# Patient Record
Sex: Male | Born: 1975 | Race: White | Hispanic: No | Marital: Single | Smoking: Former smoker
Health system: Southern US, Community
[De-identification: ages and names within clinical notes are randomized; demographics above are authoritative.]

## PROBLEM LIST (undated history)

## (undated) DIAGNOSIS — R7303 Prediabetes: Secondary | ICD-10-CM

## (undated) DIAGNOSIS — I10 Essential (primary) hypertension: Secondary | ICD-10-CM

## (undated) DIAGNOSIS — I509 Heart failure, unspecified: Secondary | ICD-10-CM

## (undated) DIAGNOSIS — Z6841 Body Mass Index (BMI) 40.0 and over, adult: Secondary | ICD-10-CM

## (undated) DIAGNOSIS — Z86711 Personal history of pulmonary embolism: Secondary | ICD-10-CM

## (undated) DIAGNOSIS — J189 Pneumonia, unspecified organism: Secondary | ICD-10-CM

## (undated) DIAGNOSIS — J45909 Unspecified asthma, uncomplicated: Secondary | ICD-10-CM

## (undated) DIAGNOSIS — Z973 Presence of spectacles and contact lenses: Secondary | ICD-10-CM

## (undated) DIAGNOSIS — G4730 Sleep apnea, unspecified: Secondary | ICD-10-CM

## (undated) DIAGNOSIS — M1990 Unspecified osteoarthritis, unspecified site: Secondary | ICD-10-CM

## (undated) DIAGNOSIS — S83206A Unspecified tear of unspecified meniscus, current injury, right knee, initial encounter: Secondary | ICD-10-CM

## (undated) HISTORY — PX: HERNIA REPAIR: SHX51

## (undated) HISTORY — PX: TONSILLECTOMY: SUR1361

## (undated) HISTORY — PX: EYE SURGERY: SHX253

---

## 2008-07-01 ENCOUNTER — Emergency Department (HOSPITAL_COMMUNITY): Admission: EM | Admit: 2008-07-01 | Discharge: 2008-07-01 | Payer: Self-pay | Admitting: Emergency Medicine

## 2010-01-01 IMAGING — CR DG CHEST 2V
2 series · 2 of 2 positions shown · non-contrast
Comparison: None

CLINICAL DATA: Shortness of breath and cough, right chest pain

CHEST - 2 VIEW

[w chest pa]
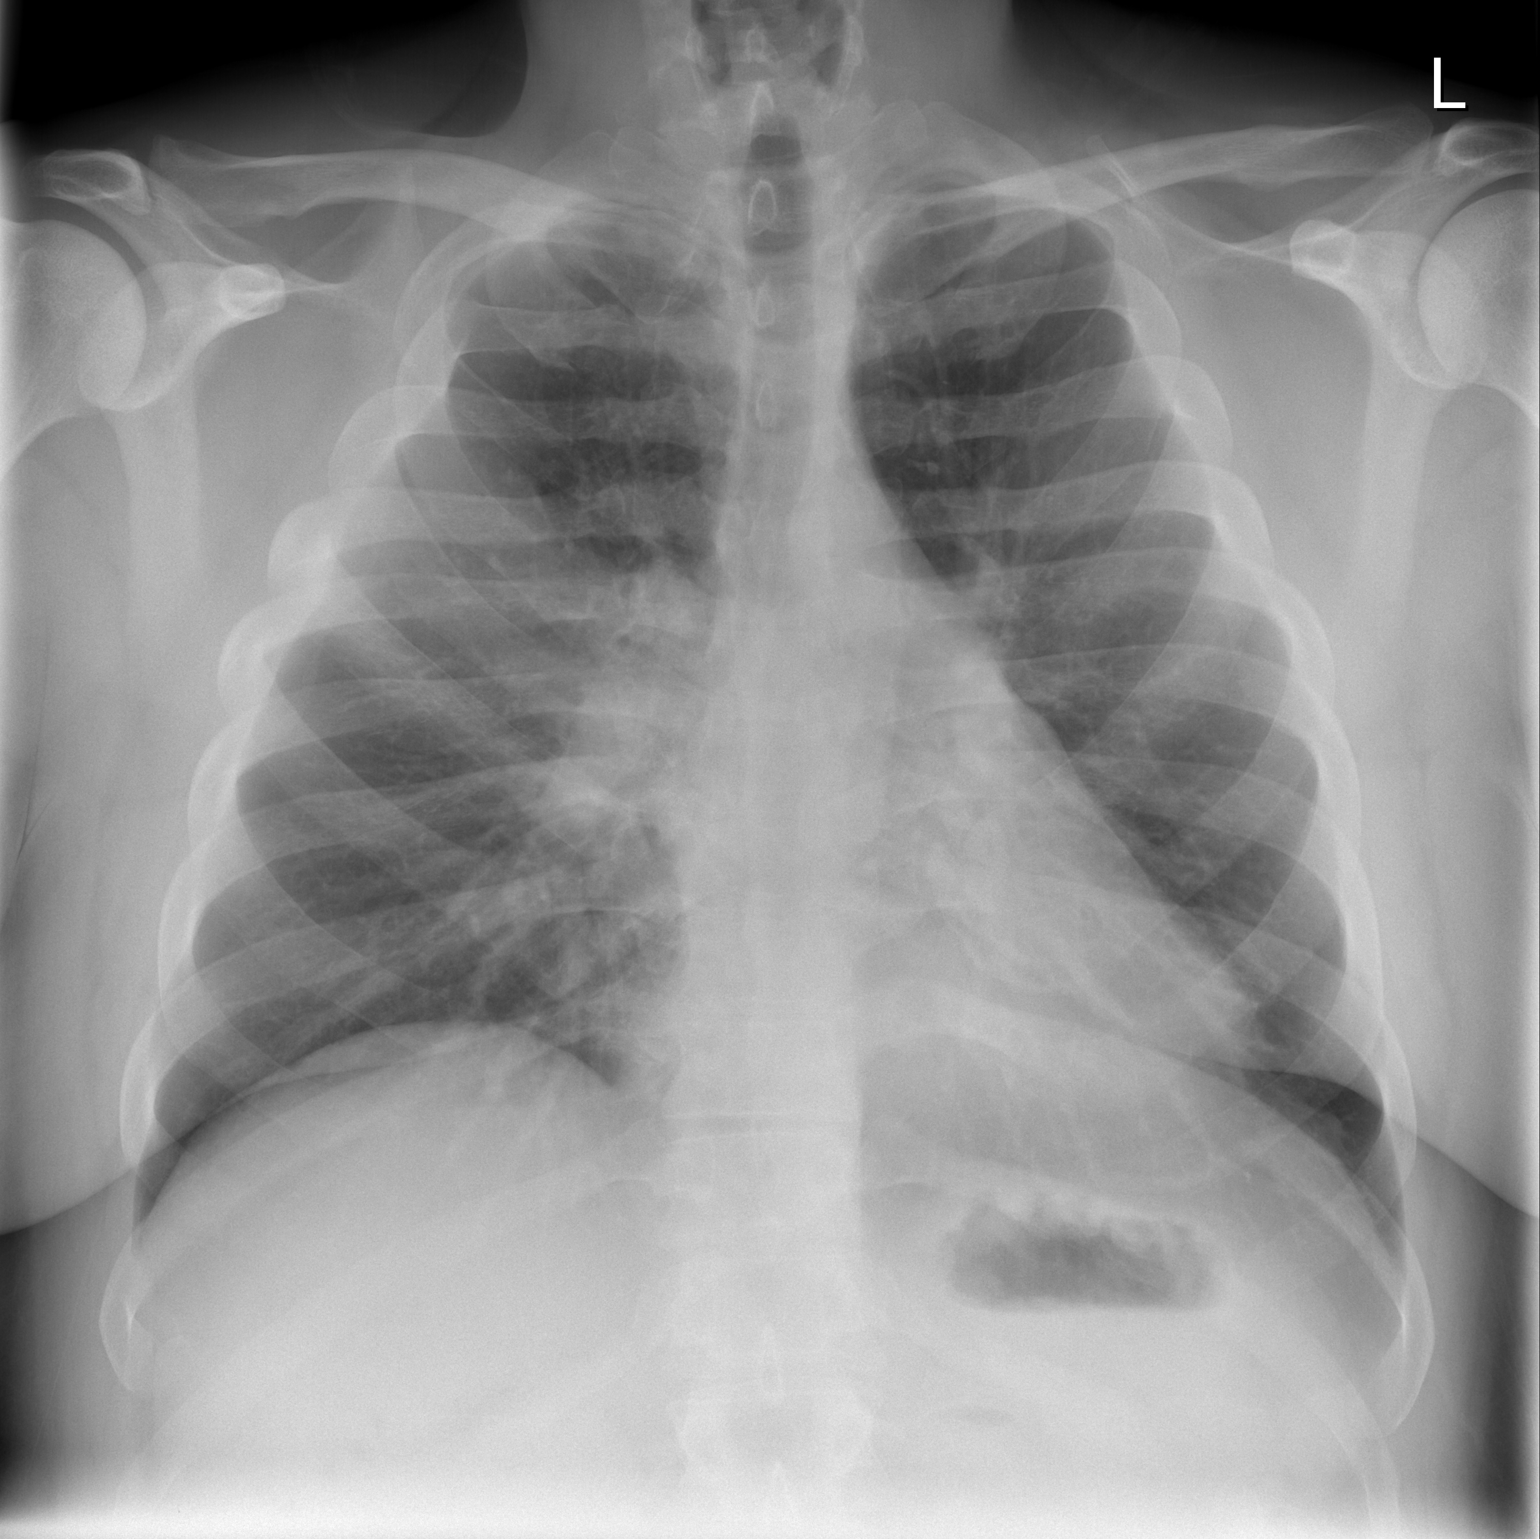

[w chest lat]
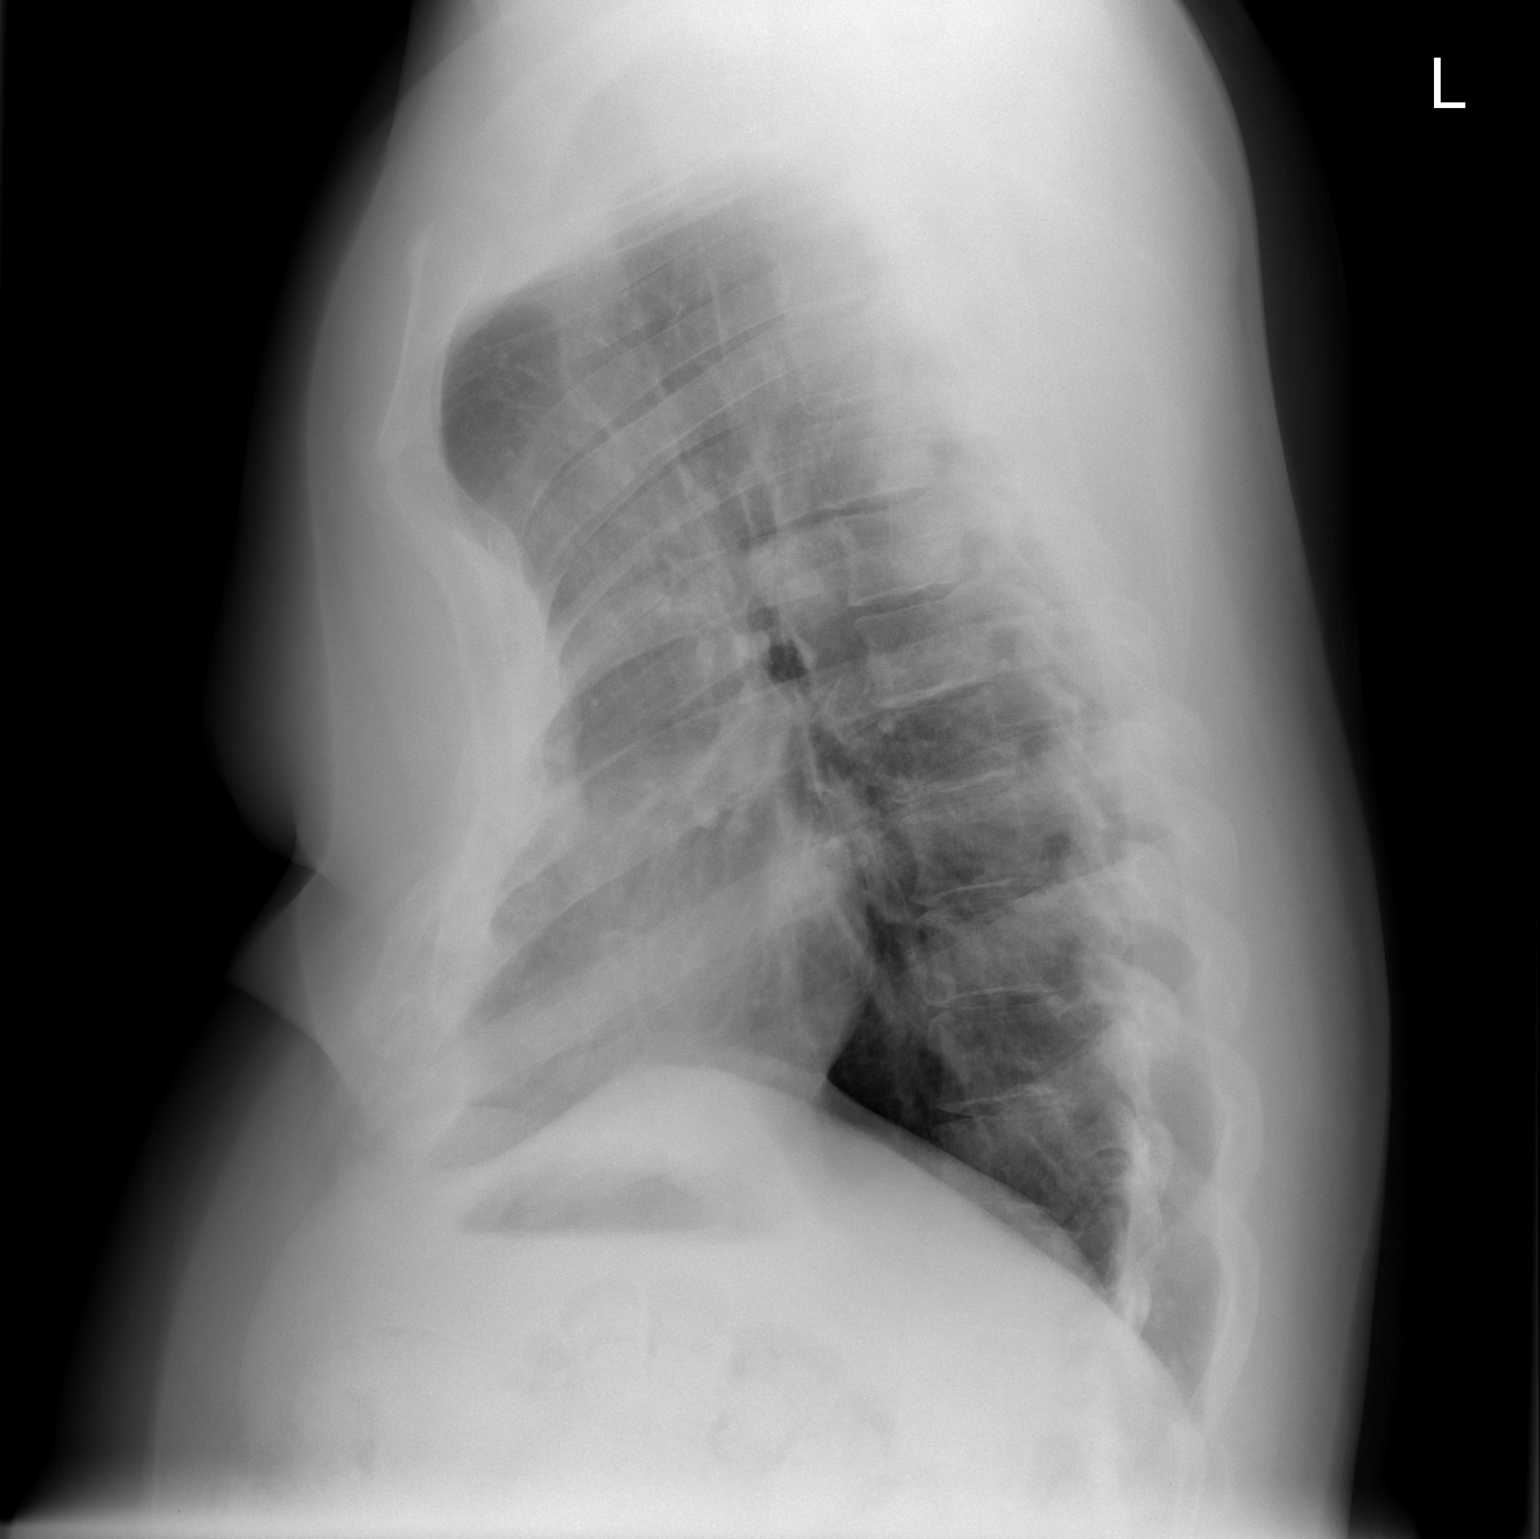

[2 of 2 positions shown; findings below may reference images not displayed]

FINDINGS: There is a pectus variant deformity of the thoracic cage.
There is mild central peribronchial thickening.  No confluent
airspace infiltrate or overt edema.  No effusion.  Heart size
normal.
IMPRESSION: Mild central peribronchial thickening suggesting bronchitis,
asthma, or viral syndrome.

## 2010-08-08 LAB — CBC
HCT: 45.2 % (ref 39.0–52.0)
Hemoglobin: 14.8 g/dL (ref 13.0–17.0)
MCHC: 32.8 g/dL (ref 30.0–36.0)
MCV: 88.2 fL (ref 78.0–100.0)
RBC: 5.13 MIL/uL (ref 4.22–5.81)
RDW: 13.8 % (ref 11.5–15.5)

## 2010-08-08 LAB — POCT CARDIAC MARKERS
CKMB, poc: 2.6 ng/mL (ref 1.0–8.0)
Myoglobin, poc: 67.4 ng/mL (ref 12–200)
Myoglobin, poc: 70.6 ng/mL (ref 12–200)
Troponin i, poc: <0.05 ng/mL (ref 0.00–0.09)

## 2010-08-08 LAB — DIFFERENTIAL
Basophils Relative: 0 % (ref 0–1)
Eosinophils Relative: 4 % (ref 0–5)
Monocytes Absolute: 0.6 10*3/uL (ref 0.1–1.0)
Monocytes Relative: 7 % (ref 3–12)
Neutro Abs: 6 10*3/uL (ref 1.7–7.7)

## 2010-08-08 LAB — BASIC METABOLIC PANEL
CO2: 26 mEq/L (ref 19–32)
Calcium: 8.8 mg/dL (ref 8.4–10.5)
GFR calc Af Amer: >60 mL/min (ref >60)
GFR calc non Af Amer: >60 mL/min (ref >60)
Potassium: 3.9 mEq/L (ref 3.5–5.1)
Sodium: 137 mEq/L (ref 135–145)

## 2013-03-07 ENCOUNTER — Emergency Department (HOSPITAL_COMMUNITY)
Admission: EM | Admit: 2013-03-07 | Discharge: 2013-03-07 | Disposition: A | Discharge status: Home / Self Care | Payer: Self-pay | Attending: Emergency Medicine | Admitting: Emergency Medicine

## 2013-03-07 ENCOUNTER — Encounter (HOSPITAL_COMMUNITY): Payer: Self-pay | Admitting: Emergency Medicine

## 2013-03-07 DIAGNOSIS — F17200 Nicotine dependence, unspecified, uncomplicated: Secondary | ICD-10-CM | POA: Insufficient documentation

## 2013-03-07 DIAGNOSIS — Y92009 Unspecified place in unspecified non-institutional (private) residence as the place of occurrence of the external cause: Secondary | ICD-10-CM | POA: Insufficient documentation

## 2013-03-07 DIAGNOSIS — Y9389 Activity, other specified: Secondary | ICD-10-CM | POA: Insufficient documentation

## 2013-03-07 DIAGNOSIS — T401X1A Poisoning by heroin, accidental (unintentional), initial encounter: Secondary | ICD-10-CM | POA: Insufficient documentation

## 2013-03-07 DIAGNOSIS — T401X4A Poisoning by heroin, undetermined, initial encounter: Secondary | ICD-10-CM | POA: Insufficient documentation

## 2013-03-07 DIAGNOSIS — I498 Other specified cardiac arrhythmias: Secondary | ICD-10-CM | POA: Insufficient documentation

## 2013-03-07 LAB — COMPREHENSIVE METABOLIC PANEL
AST: 47 U/L — ABNORMAL HIGH (ref 0–37)
Albumin: 3.5 g/dL (ref 3.5–5.2)
Calcium: 9 mg/dL (ref 8.4–10.5)
Creatinine, Ser: 0.92 mg/dL (ref 0.50–1.35)
Total Protein: 8.7 g/dL — ABNORMAL HIGH (ref 6.0–8.3)

## 2013-03-07 LAB — CBC
MCH: 29.9 pg (ref 26.0–34.0)
MCV: 92.3 fL (ref 78.0–100.0)
Platelets: 235 10*3/uL (ref 150–400)
RDW: 14.3 % (ref 11.5–15.5)

## 2013-03-07 LAB — ACETAMINOPHEN LEVEL: Acetaminophen (Tylenol), Serum: <15 ug/mL (ref 10–30)

## 2013-03-07 LAB — SALICYLATE LEVEL: Salicylate Lvl: <2 mg/dL — ABNORMAL LOW (ref 2.8–20.0)

## 2013-03-07 MED ORDER — SODIUM CHLORIDE 0.9 % IV BOLUS (SEPSIS)
1000.0000 mL | Freq: Once | INTRAVENOUS | Status: AC
Start: 2013-03-07 — End: 2013-03-07
  Administered 2013-03-07: 1000 mL via INTRAVENOUS

## 2013-03-07 MED ORDER — ONDANSETRON HCL 4 MG/2ML IJ SOLN
4.0000 mg | Freq: Once | INTRAMUSCULAR | Status: AC
  Administered 2013-03-07: 4 mg via INTRAVENOUS
  Filled 2013-03-07: qty 2

## 2013-03-07 NOTE — ED Notes (Signed)
Pt arrived via EMS with a complaint of a heroin overdose.  Pt states he used a new supplier and shot up in front of friends and immediately passed out.  Friends called EMS.  Upon arrival EMS assessed that the patient was not breathing and a pulse of 50, with a palpated BP of 100.  EMS administered 2 Narcan and patient responded and regained consciousness.

## 2013-03-07 NOTE — ED Provider Notes (Signed)
CSN: [id number]     Arrival date & time 03/07/13  0223 History   First MD Initiated Contact with Patient 03/07/13 0241     Chief Complaint  Patient presents with  . Medical Clearance  . Drug Overdose   (Consider location/radiation/quality/duration/timing/severity/associated sxs/prior Treatment) HPI History provided by patient and EMS.  Was at a friend's house tonight, injected heroin and became unresponsive. EMS was called, reportedly bradycardic without respirations and patient was given Narcan. He regained consciousness and was brought to the emergency department. 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 He states that he has been hanging out with the "wrong crowd".  He has a primary care physician in [place] and is prescribed medications for blood pressure and diabetes, states he is not taking his medications in some time. He declines any information on drug rehabilitation or desire for detox.  At time of my evaluation, he would like to be discharged from the emergency department, but is agreeable to period of observation.   History reviewed. No pertinent past medical history. History reviewed. No pertinent past surgical history. History reviewed. No pertinent family history. History  Substance Use Topics  . Smoking status: Current Some Day Smoker  . Smokeless tobacco: Not on file  . Alcohol Use: Yes    Review of Systems  Constitutional: Negative for fever and chills.  Eyes: Negative for pain.  Respiratory: Negative for shortness of breath.   Cardiovascular: Negative for chest pain.  Gastrointestinal: Negative for vomiting and abdominal pain.  Genitourinary: Negative for dysuria.  Musculoskeletal: Negative for back pain, neck pain and neck stiffness.  Skin: Negative for rash.  Neurological: Negative for seizures and headaches.  All other systems reviewed and are negative.    Allergies  Review of patient's allergies indicates no known allergies.  Home  Medications   Current Outpatient Rx  Name  Route  Sig  Dispense  Refill  . aspirin EC 325 MG tablet   Oral   Take 325 mg by mouth every 6 (six) hours as needed.         Marland Kitchen guaiFENesin (MUCINEX) 600 MG 12 hr tablet   Oral   Take 600 mg by mouth 2 (two) times daily as needed for cough.         . Homeopathic Products (ZICAM COLD REMEDY NA)   Nasal   Place 1 spray into the nose daily as needed (cold symptoms).          BP 123/80  Pulse 84  Temp(Src) 98.2 F (36.8 C) (Oral)  Resp 16  Ht 5\' 11"  (1.803 m)  Wt 355 lb (161.027 kg)  BMI 49.53 kg/m2  SpO2 92% Physical Exam  Constitutional: He is oriented to person, place, and time. He appears well-developed and well-nourished.  HENT:  Head: Normocephalic and atraumatic.  Mouth/Throat: Oropharynx is clear and moist.  Eyes: EOM are normal. Pupils are equal, round, and reactive to light. No scleral icterus.  Neck: Neck supple.  Cardiovascular: Normal rate, regular rhythm and intact distal pulses.   Pulmonary/Chest: Effort normal and breath sounds normal. No respiratory distress. He exhibits no tenderness.  Abdominal: Soft. Bowel sounds are normal. He exhibits no distension. There is no tenderness.  Musculoskeletal: Normal range of motion. He exhibits no edema.  Neurological: He is alert and oriented to person, place, and time. He displays normal reflexes. No cranial nerve deficit. He exhibits normal muscle tone. Coordination normal.  Skin: Skin is warm and dry.    ED Course  Procedures (including critical care time) Labs Review Labs Reviewed  GLUCOSE, CAPILLARY - Abnormal; Notable for the following:    Glucose-Capillary 189 (*)    All other components within normal limits  COMPREHENSIVE METABOLIC PANEL - Abnormal; Notable for the following:    Glucose, Bld 190 (*)    Total Protein 8.7 (*)    AST 47 (*)    All other components within normal limits  SALICYLATE LEVEL - Abnormal; Notable for the following:    Salicylate Lvl  <2.0 (*)    All other components within normal limits  CBC  ETHANOL  ACETAMINOPHEN LEVEL  URINE RAPID DRUG SCREEN (HOSP PERFORMED)     Date: 03/07/2013  Rate: 84  Rhythm: normal sinus rhythm  QRS Axis: normal  Intervals: normal  ST/T Wave abnormalities: nonspecific ST/T changes  Conduction Disutrbances:none  Narrative Interpretation:   Old EKG Reviewed: none available  IV fluids. Cardiac monitoring. Serial evaluations without significant change in condition  6:44 AM patient awake and ambulating, drinking water and requesting to be discharged home. Patient calling for a ride. Outpatient resources provided. Strict return precautions verbalized is understood.  MDM  Heroin overdose - reversed with Narcan prior to arrival  Observed in the emergency department without complications Labs obtained and reviewed as above. vital signs and nursing notes reviewed and considered    Sunnie Nielsen, MD 03/07/13 [phone]

## 2013-03-07 NOTE — ED Notes (Signed)
Bed: RESB Expected date: 03/07/13 Expected time: 2:19 AM Means of arrival: Ambulance Comments: Res B, EMS, 69 M, Heroin OD

## 2016-07-14 ENCOUNTER — Ambulatory Visit (INDEPENDENT_AMBULATORY_CARE_PROVIDER_SITE_OTHER): Payer: Worker's Compensation

## 2016-07-14 ENCOUNTER — Ambulatory Visit (INDEPENDENT_AMBULATORY_CARE_PROVIDER_SITE_OTHER): Payer: Worker's Compensation | Admitting: Orthopaedic Surgery

## 2016-07-14 DIAGNOSIS — M25561 Pain in right knee: Secondary | ICD-10-CM | POA: Diagnosis not present

## 2016-07-14 MED ORDER — METHYLPREDNISOLONE ACETATE 40 MG/ML IJ SUSP
40.0000 mg | INTRAMUSCULAR | Status: AC | PRN
Start: 2016-07-14 — End: 2016-07-14
  Administered 2016-07-14: 40 mg via INTRA_ARTICULAR

## 2016-07-14 NOTE — Progress Notes (Deleted)
   Office Visit Note   Patient: Kenneth Frazier           Date of Birth: 1975/12/26           MRN: [id number] Visit Date: 07/14/2016              Requested by: No referring provider defined for this encounter. PCP: No primary care provider on file.   Assessment & Plan: Visit Diagnoses:  1. Acute pain of right knee     Plan: ***  Follow-Up Instructions: Return in about 3 weeks (around 08/04/2016).   Orders:  Orders Placed This Encounter  Procedures  . Large Joint Injection/Arthrocentesis  . XR Knee 1-2 Views Right   No orders of the defined types were placed in this encounter.     Procedures: No procedures performed   Clinical Data: No additional findings.   Subjective: No chief complaint on file.   HPI  Review of Systems   Objective: Vital Signs: There were no vitals taken for this visit.  Physical Exam  Ortho Exam  Specialty Comments:  No specialty comments available.  Imaging: Xr Knee 1-2 Views Right  Result Date: 07/14/2016 An AP and lateral of his right knee show good alignment overall. There is some slight medial joint space narrowing which is more age-related. There is no evidence of fracture. Is no effusion.    PMFS History: There are no active problems to display for this patient.  No past medical history on file.  No family history on file.  No past surgical history on file. Social History   Occupational History  . Not on file.   Social History Main Topics  . Smoking status: Current Some Day Smoker  . Smokeless tobacco: Not on file  . Alcohol use Yes  . Drug use: Yes  . Sexual activity: Yes                                                                                                               ------

## 2016-07-14 NOTE — Progress Notes (Signed)
   Office Visit Note   Patient: Kenneth Frazier           Date of Birth: [birth year]           MRN: [id number] Visit Date: 07/14/2016              Requested by: No referring provider defined for this encounter. PCP: No primary care provider on file.   Assessment & Plan: Visit Diagnoses:  1. Acute pain of right knee     Plan: He tolerated the steroid injection well and his right knee. I will have him discontinue his knee brace and work on quad training exercises. Again is really pain-free stitches knee medications but worried about instability symptoms of these having. If he still having the symptoms on his next visit this would definitely warranted MRI. This would be to assess the meniscus of his knee. He'll continue his knee brace and quad strengthening exercises and physical therapy in the interim.  Follow-Up Instructions: Return in about 3 weeks (around 08/04/2016).   Orders:  Orders Placed This Encounter  Procedures  . Large Joint Injection/Arthrocentesis  . XR Knee 1-2 Views Right   No orders of the defined types were placed in this encounter.     Procedures: Large Joint Inj Date/Time: 07/14/2016 3:43 PM Performed by: Kathryne HitchBLACKMAN, Jastin Fore Y Authorized by: Kathryne HitchBLACKMAN, Thedore Pickel Y   Location:  Knee Site:  R knee Ultrasound Guidance: No   Fluoroscopic Guidance: No   Arthrogram: No   Medications:  40 mg methylPREDNISolone acetate 40 MG/ML     Clinical Data: No additional findings.   Subjective: No chief complaint on file. Patient is a very pleasant 41 year old gentleman with no previous right knee problems who injured his right knee when he actually slipped on wet floor in the bathroom at his job. His knee hyperextended and torqued a little bit. He said posterior pain since then. He is tried rest, ice, heat, anti-inflammatories and physical therapy. He says the knee really doesn't hurt much and he hasn't missed any work it just feels unstable to him. He is wearing a  knee brace. He is someone who does weigh about 400 pounds but again these had no previous knee problems before this.  HPI  Review of Systems Currently denies any headache, shortness of breath, fever, chills, nausea, vomiting.  Objective: Vital Signs: There were no vitals taken for this visit.  Physical Exam He is a very pleasant individual who is alert and oriented 3 in no acute distress Ortho Exam Examination of his right knee shows no effusion with good range of motion. He does have posterior lateral and posterior medial tenderness on exam. His Lachman's is negative but is really hard to examine based on his size is getting good range of motion the knee. His extensor mechanism is intact. Specialty Comments:  No specialty comments available.  Imaging: No results found.   PMFS History: There are no active problems to display for this patient.  No past medical history on file.  No family history on file.  No past surgical history on file. Social History   Occupational History  . Not on file.   Social History Main Topics  . Smoking status: Current Some Day Smoker  . Smokeless tobacco: Not on file  . Alcohol use Yes  . Drug use: Yes  . Sexual activity: Yes

## 2016-07-16 ENCOUNTER — Telehealth (INDEPENDENT_AMBULATORY_CARE_PROVIDER_SITE_OTHER): Payer: Self-pay | Admitting: Orthopaedic Surgery

## 2016-07-16 NOTE — Telephone Encounter (Signed)
[facility] WANTS TO KNOW IF PT NEEDS TO CONTINUE PT, IF SO IF WE CAN SEND THEM AN ORDER AS WELL AS A OFFICE NOTE.  [phone] FAX  [phone] CALL

## 2016-07-16 NOTE — Telephone Encounter (Signed)
He does not need to continue PT.

## 2016-07-16 NOTE — Telephone Encounter (Signed)
LMOM of the below message  

## 2016-07-16 NOTE — Telephone Encounter (Signed)
Please advise 

## 2016-07-17 ENCOUNTER — Telehealth (INDEPENDENT_AMBULATORY_CARE_PROVIDER_SITE_OTHER): Payer: Self-pay | Admitting: Orthopaedic Surgery

## 2016-07-17 NOTE — Telephone Encounter (Signed)
[facility] states that the patient called them stating that Dr. Magnus IvanBlackman told him he was to continue with his physcial therapy.  Thank you.

## 2016-07-17 NOTE — Telephone Encounter (Signed)
I just wanted therapy to work on his mobility and gait training only with no strengthening of his right hip.

## 2016-07-17 NOTE — Telephone Encounter (Signed)
Called Paula back and no answer LMOM with all details.

## 2016-07-17 NOTE — Telephone Encounter (Signed)
Gunnar FusiPaula from [facility] was returning you phone call. CB # [phone]

## 2016-07-17 NOTE — Telephone Encounter (Signed)
Paula from [facility] was returning you phone call. CB # [phone] 

## 2016-07-17 NOTE — Telephone Encounter (Signed)
They need clarification need CB.

## 2016-07-17 NOTE — Telephone Encounter (Signed)
Pt called back about this stating they do not have orders

## 2016-07-18 ENCOUNTER — Telehealth (INDEPENDENT_AMBULATORY_CARE_PROVIDER_SITE_OTHER): Payer: Self-pay

## 2016-07-18 NOTE — Telephone Encounter (Signed)
[place] [facility] [facility] rx for this patient. Wrote a rx based off of Dr. Vevelyn RoyalsBlackmans response in a previous message and faxed to her @ [phone].

## 2016-08-06 ENCOUNTER — Ambulatory Visit (INDEPENDENT_AMBULATORY_CARE_PROVIDER_SITE_OTHER): Payer: Worker's Compensation | Admitting: Orthopaedic Surgery

## 2016-08-06 DIAGNOSIS — M25561 Pain in right knee: Secondary | ICD-10-CM | POA: Diagnosis not present

## 2016-08-06 NOTE — Progress Notes (Signed)
The patient is continue to follow-up for his right knee. His knee was injured on-the-job in February. Prior to that he had never had any problems with his knee. He still been working on physical therapy and that is helped some but he still having some mechanical symptoms of the right knee on the medial joint line. He gets some locking catching as well. He has tried anti-inflammatories, rest, ice, heat and most recently a intra-articular steroid injection. He said the steroid injection helped great for about 2 weeks and now is back to have mechanical symptoms again in his knee.  On examination he definitely has a positive Murray sign to the medial compartment of his right knee. He has pain over the medial collateral ligament but is ligamentous is stable. His pain over the medial joint line and the meniscus itself.  At this point given the failure of all forms conservative treatment is continued pain and mechanical symptoms and MRI is warranted to rule out a meniscal tear. He'll continue physical therapy on his knee for to strengthen the knee and working on balance and proprioception but definitely an MRI is medically necessary. He'll continue his current levels of work duties.

## 2016-08-07 ENCOUNTER — Other Ambulatory Visit (INDEPENDENT_AMBULATORY_CARE_PROVIDER_SITE_OTHER): Payer: Self-pay

## 2016-08-07 DIAGNOSIS — G8929 Other chronic pain: Secondary | ICD-10-CM

## 2016-08-07 DIAGNOSIS — M25561 Pain in right knee: Principal | ICD-10-CM

## 2016-09-03 ENCOUNTER — Encounter (INDEPENDENT_AMBULATORY_CARE_PROVIDER_SITE_OTHER): Payer: Self-pay | Admitting: Orthopaedic Surgery

## 2016-09-03 ENCOUNTER — Ambulatory Visit (INDEPENDENT_AMBULATORY_CARE_PROVIDER_SITE_OTHER): Payer: Worker's Compensation | Admitting: Orthopaedic Surgery

## 2016-09-03 VITALS — Ht 71.0 in | Wt 355.0 lb

## 2016-09-03 DIAGNOSIS — S83281A Other tear of lateral meniscus, current injury, right knee, initial encounter: Secondary | ICD-10-CM | POA: Insufficient documentation

## 2016-09-03 DIAGNOSIS — S83281D Other tear of lateral meniscus, current injury, right knee, subsequent encounter: Secondary | ICD-10-CM

## 2016-09-03 NOTE — Progress Notes (Signed)
The patient is well-known to me. He is following up after MRI of his right knee. He injured this knee accident. He is tried and failed all forms conservative treatment including rest, ice, heat, anti-inflammatories, activity modification, physical therapy, and steroid injections. He still is been work for all this causes work is low demand. With continued pain and problems with pivoting activities we sent him for an MRI to evaluate the cartilage and meniscus of his knee. He still having his same symptoms.  On exam he has medial and lateral joint line tenderness and he does have a positive McMurray sign to the medial and lateral side. Most his pain is medial. There is no effusion. His range of motion is full. His Lachman's exam is negative.  The MRI is reviewed with him and it does show a meniscal root tear the posterior horn of medial meniscus. There appears to be though a mid body tear with a flap component of the lateral meniscus. That one appears more acute. He has minimal cartilage thinning in the knee.  Given his failure conservative treatment and continued mechanical symptoms I'm recommending an arthroscopic intervention for his right knee. I showed him a knee model and explained in detail what surgery would involve some this is something is definitely interested in given his continued mechanical symptoms. We talked about the risk and benefits of surgery and what is intraoperative and postoperative course would be. I would let him get back to work within a few days after surgery as well since he has low demand work and is motivated to do so. Since this is [employer], we will need to get approval for this surgery and he understands this as well. All questions were encouraged and answered. If surgery is approved we will then see him back at one week postoperative for suture removal.

## 2016-09-08 ENCOUNTER — Telehealth (INDEPENDENT_AMBULATORY_CARE_PROVIDER_SITE_OTHER): Payer: Self-pay

## 2016-09-08 NOTE — Telephone Encounter (Signed)
Faxed the 09/03/16 office note to adj per her request 

## 2016-09-12 ENCOUNTER — Telehealth (INDEPENDENT_AMBULATORY_CARE_PROVIDER_SITE_OTHER): Payer: Self-pay | Admitting: Orthopaedic Surgery

## 2016-09-12 NOTE — Telephone Encounter (Signed)
[facility] CALLED, LEFT VM AND REQUESTED PT MRI RESULTS AND LAST OFFICE NOTE FAXED TO [phone] PLEASE.

## 2016-09-15 NOTE — Telephone Encounter (Signed)
MRI results and Last ov notes sent to [facility] as requested

## 2016-09-24 ENCOUNTER — Other Ambulatory Visit (INDEPENDENT_AMBULATORY_CARE_PROVIDER_SITE_OTHER): Payer: Self-pay | Admitting: Physician Assistant

## 2016-09-24 ENCOUNTER — Encounter (HOSPITAL_COMMUNITY): Payer: Self-pay | Admitting: *Deleted

## 2016-09-24 NOTE — Progress Notes (Signed)
Anesthesia Chart Review:  Pt is a same day work up  Pt is a 41 year old male scheduled for R knee arthroscopy with partial meniscectomy on 09/25/2016 with Doneen Poissonhristopher Blackman, M.D.  [facility]: CHF, HTN, borderline diabetes, asthma, PE, OSA. Former smoker. BMI 49.5.  - [facility] with PE. Was on coumadin through early 2017 when he stopped going to anti-coag visits and was [facility].   - [facility] for acute on chronic COPD, hypoxic/mixed respiratory failure, OSA (started on bipap)  Medications include: ASA 325 mg.  Labs will be obtained DOS  EKG will be obtained DOS  Echo 12/13/14 (care everywhere): 1. Very poor image quality. 2. LV size is normal. Systolic function is normal. EF 55-60%. Unable to fully assess LV regional wall motion. Line 2. RV normal in size and function. 3. LA, RA, cardiac valves not well visualized. 4. No pericardial effusion 5. Not suitable for comparison with any study.  Right heart cath 11/07/14 (care everywhere): - Pulmonary Hypertension: Yes - Elevated RA pressure - Elevated wedge pressure - No oxygen step up between SVC and PA  - Mild pulmonary hypertension - Low cardiac output  Pt will need further assessment by assigned anesthesiologist DOS.   Rica Mastngela Jeralynn Vaquera, FNP-BC [facility]Anesthesiology Phone: [phone] 09/24/2016 4:00 PM

## 2016-09-24 NOTE — Progress Notes (Signed)
Pt denies any acute cardiopulmonary issues. Pt denies being under the care of a cardiologist. Pt denies having a stress test. Pt denies having an EKG and chest x ray within the last year. Pt made aware to stop taking  Aspirin, vitamins, fish oil and herbal medications. Do not take any NSAIDs ie: Ibuprofen, Advil, Naproxen, BC and Goody Powder or any medication containing Aspirin. Pt verbalized understanding of all pre-op instructions. Anesthesia asked to review pt history.

## 2016-09-25 ENCOUNTER — Ambulatory Visit (HOSPITAL_COMMUNITY)
Admission: RE | Admit: 2016-09-25 | Discharge: 2016-09-25 | Disposition: A | Discharge status: Home / Self Care | Payer: Worker's Compensation | Source: Ambulatory Visit | Attending: Orthopaedic Surgery | Admitting: Orthopaedic Surgery

## 2016-09-25 ENCOUNTER — Encounter (HOSPITAL_COMMUNITY): Payer: Self-pay | Admitting: Certified Registered Nurse Anesthetist

## 2016-09-25 ENCOUNTER — Encounter (HOSPITAL_COMMUNITY)
Admission: RE | Disposition: A | Discharge status: Home / Self Care | Payer: Self-pay | Source: Ambulatory Visit | Attending: Orthopaedic Surgery

## 2016-09-25 ENCOUNTER — Ambulatory Visit (HOSPITAL_COMMUNITY): Payer: Worker's Compensation | Admitting: Emergency Medicine

## 2016-09-25 DIAGNOSIS — G4730 Sleep apnea, unspecified: Secondary | ICD-10-CM | POA: Insufficient documentation

## 2016-09-25 DIAGNOSIS — J45909 Unspecified asthma, uncomplicated: Secondary | ICD-10-CM | POA: Diagnosis not present

## 2016-09-25 DIAGNOSIS — Z79899 Other long term (current) drug therapy: Secondary | ICD-10-CM | POA: Insufficient documentation

## 2016-09-25 DIAGNOSIS — X58XXXA Exposure to other specified factors, initial encounter: Secondary | ICD-10-CM | POA: Diagnosis not present

## 2016-09-25 DIAGNOSIS — Y990 Civilian activity done for income or pay: Secondary | ICD-10-CM | POA: Diagnosis not present

## 2016-09-25 DIAGNOSIS — S83241A Other tear of medial meniscus, current injury, right knee, initial encounter: Secondary | ICD-10-CM | POA: Insufficient documentation

## 2016-09-25 DIAGNOSIS — Z86711 Personal history of pulmonary embolism: Secondary | ICD-10-CM | POA: Insufficient documentation

## 2016-09-25 DIAGNOSIS — Z6841 Body Mass Index (BMI) 40.0 and over, adult: Secondary | ICD-10-CM | POA: Diagnosis not present

## 2016-09-25 DIAGNOSIS — I110 Hypertensive heart disease with heart failure: Secondary | ICD-10-CM | POA: Diagnosis not present

## 2016-09-25 DIAGNOSIS — S83281D Other tear of lateral meniscus, current injury, right knee, subsequent encounter: Secondary | ICD-10-CM | POA: Diagnosis not present

## 2016-09-25 DIAGNOSIS — Z87891 Personal history of nicotine dependence: Secondary | ICD-10-CM | POA: Diagnosis not present

## 2016-09-25 DIAGNOSIS — R7303 Prediabetes: Secondary | ICD-10-CM | POA: Diagnosis not present

## 2016-09-25 DIAGNOSIS — I509 Heart failure, unspecified: Secondary | ICD-10-CM | POA: Insufficient documentation

## 2016-09-25 DIAGNOSIS — S83281A Other tear of lateral meniscus, current injury, right knee, initial encounter: Secondary | ICD-10-CM | POA: Diagnosis present

## 2016-09-25 DIAGNOSIS — Y9289 Other specified places as the place of occurrence of the external cause: Secondary | ICD-10-CM | POA: Diagnosis not present

## 2016-09-25 HISTORY — DX: Unspecified tear of unspecified meniscus, current injury, right knee, initial encounter: S83.206A

## 2016-09-25 HISTORY — DX: Unspecified osteoarthritis, unspecified site: M19.90

## 2016-09-25 HISTORY — DX: Morbid (severe) obesity due to excess calories: E66.01

## 2016-09-25 HISTORY — PX: KNEE ARTHROSCOPY: SHX127

## 2016-09-25 HISTORY — DX: Essential (primary) hypertension: I10

## 2016-09-25 HISTORY — DX: Heart failure, unspecified: I50.9

## 2016-09-25 HISTORY — DX: Personal history of pulmonary embolism: Z86.711

## 2016-09-25 HISTORY — DX: Body Mass Index (BMI) 40.0 and over, adult: Z684

## 2016-09-25 HISTORY — DX: Pneumonia, unspecified organism: J18.9

## 2016-09-25 HISTORY — DX: Presence of spectacles and contact lenses: Z97.3

## 2016-09-25 HISTORY — DX: Unspecified asthma, uncomplicated: J45.909

## 2016-09-25 HISTORY — DX: Prediabetes: R73.03

## 2016-09-25 HISTORY — DX: Sleep apnea, unspecified: G47.30

## 2016-09-25 LAB — CBC
HCT: 44.9 % (ref 39.0–52.0)
HEMOGLOBIN: 14.8 g/dL (ref 13.0–17.0)
MCH: 30.1 pg (ref 26.0–34.0)
MCHC: 33 g/dL (ref 30.0–36.0)
MCV: 91.3 fL (ref 78.0–100.0)
PLATELETS: 241 10*3/uL (ref 150–400)
RBC: 4.92 MIL/uL (ref 4.22–5.81)
RDW: 14 % (ref 11.5–15.5)
WBC: 7.7 10*3/uL (ref 4.0–10.5)

## 2016-09-25 LAB — BASIC METABOLIC PANEL
ANION GAP: 10 (ref 5–15)
BUN: 14 mg/dL (ref 6–20)
CHLORIDE: 102 mmol/L (ref 101–111)
CO2: 23 mmol/L (ref 22–32)
Calcium: 8.5 mg/dL — ABNORMAL LOW (ref 8.9–10.3)
Creatinine, Ser: 0.92 mg/dL (ref 0.61–1.24)
Glucose, Bld: 168 mg/dL — ABNORMAL HIGH (ref 65–99)
POTASSIUM: 5.3 mmol/L — AB (ref 3.5–5.1)
Sodium: 135 mmol/L (ref 135–145)

## 2016-09-25 SURGERY — ARTHROSCOPY, KNEE
Anesthesia: General | Site: Knee | Laterality: Right

## 2016-09-25 MED ORDER — FENTANYL CITRATE (PF) 100 MCG/2ML IJ SOLN
INTRAMUSCULAR | Status: DC | PRN
  Administered 2016-09-25: 50 ug via INTRAVENOUS
  Administered 2016-09-25: 150 ug via INTRAVENOUS

## 2016-09-25 MED ORDER — PROMETHAZINE HCL 25 MG/ML IJ SOLN: 6.2500 mg | INTRAMUSCULAR | Status: DC | PRN

## 2016-09-25 MED ORDER — BUPIVACAINE HCL (PF) 0.25 % IJ SOLN
INTRAMUSCULAR | Status: DC | PRN
  Administered 2016-09-25: 30 mL

## 2016-09-25 MED ORDER — HYDROCODONE-ACETAMINOPHEN 5-325 MG PO TABS
ORAL_TABLET | ORAL | Status: AC
  Filled 2016-09-25: qty 1

## 2016-09-25 MED ORDER — FENTANYL CITRATE (PF) 250 MCG/5ML IJ SOLN
INTRAMUSCULAR | Status: AC
  Filled 2016-09-25: qty 5

## 2016-09-25 MED ORDER — MIDAZOLAM HCL 2 MG/2ML IJ SOLN
2.0000 mg | Freq: Once | INTRAMUSCULAR | Status: DC
  Filled 2016-09-25: qty 2

## 2016-09-25 MED ORDER — PROPOFOL 10 MG/ML IV BOLUS
INTRAVENOUS | Status: AC
  Filled 2016-09-25: qty 20

## 2016-09-25 MED ORDER — HYDROMORPHONE HCL 1 MG/ML IJ SOLN
INTRAMUSCULAR | Status: AC
  Filled 2016-09-25: qty 0.5

## 2016-09-25 MED ORDER — RIVAROXABAN 10 MG PO TABS: 10.0000 mg | ORAL_TABLET | Freq: Every day | ORAL | 0 refills | Status: DC

## 2016-09-25 MED ORDER — MIDAZOLAM HCL 2 MG/2ML IJ SOLN
INTRAMUSCULAR | Status: AC
  Filled 2016-09-25: qty 2

## 2016-09-25 MED ORDER — HYDROMORPHONE HCL 1 MG/ML IJ SOLN
INTRAMUSCULAR | Status: AC
  Filled 2016-09-25: qty 1

## 2016-09-25 MED ORDER — PROPOFOL 10 MG/ML IV BOLUS
INTRAVENOUS | Status: DC | PRN
  Administered 2016-09-25: 200 mg via INTRAVENOUS
  Administered 2016-09-25: 40 mg via INTRAVENOUS
  Administered 2016-09-25: 60 mg via INTRAVENOUS

## 2016-09-25 MED ORDER — LIDOCAINE 2% (20 MG/ML) 5 ML SYRINGE
INTRAMUSCULAR | Status: DC | PRN
  Administered 2016-09-25: 100 mg via INTRAVENOUS

## 2016-09-25 MED ORDER — MIDAZOLAM HCL 5 MG/5ML IJ SOLN
INTRAMUSCULAR | Status: DC | PRN
  Administered 2016-09-25 (×2): 1 mg via INTRAVENOUS

## 2016-09-25 MED ORDER — MORPHINE SULFATE (PF) 4 MG/ML IV SOLN
INTRAVENOUS | Status: DC | PRN
  Administered 2016-09-25: 4 mg

## 2016-09-25 MED ORDER — LIDOCAINE 2% (20 MG/ML) 5 ML SYRINGE
INTRAMUSCULAR | Status: AC
  Filled 2016-09-25: qty 5

## 2016-09-25 MED ORDER — CHLORHEXIDINE GLUCONATE 4 % EX LIQD: 60.0000 mL | Freq: Once | CUTANEOUS | Status: DC

## 2016-09-25 MED ORDER — LACTATED RINGERS IV SOLN
INTRAVENOUS | Status: DC | PRN
  Administered 2016-09-25: 09:00:00 via INTRAVENOUS

## 2016-09-25 MED ORDER — HYDROMORPHONE HCL 1 MG/ML IJ SOLN
0.2500 mg | INTRAMUSCULAR | Status: DC | PRN
  Administered 2016-09-25 (×4): 0.25 mg via INTRAVENOUS

## 2016-09-25 MED ORDER — ROCURONIUM BROMIDE 10 MG/ML (PF) SYRINGE
PREFILLED_SYRINGE | INTRAVENOUS | Status: AC
  Filled 2016-09-25: qty 5

## 2016-09-25 MED ORDER — SUCCINYLCHOLINE CHLORIDE 200 MG/10ML IV SOSY
PREFILLED_SYRINGE | INTRAVENOUS | Status: DC | PRN
  Administered 2016-09-25: 220 mg via INTRAVENOUS

## 2016-09-25 MED ORDER — HYDROCODONE-ACETAMINOPHEN 5-325 MG PO TABS: 1.0000 | ORAL_TABLET | ORAL | 0 refills | Status: DC | PRN

## 2016-09-25 MED ORDER — SODIUM CHLORIDE 0.9 % IR SOLN
Status: DC | PRN
Start: 2016-09-25 — End: 2016-09-25
  Administered 2016-09-25: 3000 mL

## 2016-09-25 MED ORDER — ONDANSETRON HCL 4 MG/2ML IJ SOLN
INTRAMUSCULAR | Status: AC
  Filled 2016-09-25: qty 2

## 2016-09-25 MED ORDER — FENTANYL CITRATE (PF) 100 MCG/2ML IJ SOLN
100.0000 ug | Freq: Once | INTRAMUSCULAR | Status: DC
  Filled 2016-09-25: qty 2

## 2016-09-25 MED ORDER — CEFAZOLIN SODIUM-DEXTROSE 2-4 GM/100ML-% IV SOLN
2.0000 g | INTRAVENOUS | Status: AC
  Administered 2016-09-25: 2 g via INTRAVENOUS
  Filled 2016-09-25: qty 100

## 2016-09-25 MED ORDER — ONDANSETRON HCL 4 MG/2ML IJ SOLN
INTRAMUSCULAR | Status: DC | PRN
  Administered 2016-09-25: 4 mg via INTRAVENOUS

## 2016-09-25 MED ORDER — LACTATED RINGERS IV SOLN
INTRAVENOUS | Status: DC
  Administered 2016-09-25: 09:00:00 via INTRAVENOUS

## 2016-09-25 MED ORDER — BUPIVACAINE HCL (PF) 0.25 % IJ SOLN
INTRAMUSCULAR | Status: AC
  Filled 2016-09-25: qty 30

## 2016-09-25 MED ORDER — HYDROCODONE-ACETAMINOPHEN 5-325 MG PO TABS
1.0000 | ORAL_TABLET | Freq: Four times a day (QID) | ORAL | Status: DC | PRN
  Administered 2016-09-25: 1 via ORAL

## 2016-09-25 MED ORDER — MORPHINE SULFATE (PF) 4 MG/ML IV SOLN
INTRAVENOUS | Status: AC
  Filled 2016-09-25: qty 1

## 2016-09-25 SURGICAL SUPPLY — 37 items
BANDAGE ACE 6X5 VEL STRL LF (GAUZE/BANDAGES/DRESSINGS) ×2 IMPLANT
BLADE CLIPPER SURG (BLADE) IMPLANT
BLADE CUTTER GATOR 3.5 (BLADE) ×4 IMPLANT
BLADE GREAT WHITE 4.2 (BLADE) ×2 IMPLANT
DRAPE ARTHROSCOPY W/POUCH 114 (DRAPES) ×2 IMPLANT
DRAPE HALF SHEET 40X57 (DRAPES) ×2 IMPLANT
DRAPE U-SHAPE 47X51 STRL (DRAPES) ×2 IMPLANT
DRSG PAD ABDOMINAL 8X10 ST (GAUZE/BANDAGES/DRESSINGS) ×2 IMPLANT
DURAPREP 26ML APPLICATOR (WOUND CARE) ×2 IMPLANT
GAUZE SPONGE 4X4 12PLY STRL (GAUZE/BANDAGES/DRESSINGS) ×2 IMPLANT
GAUZE SPONGE 4X4 12PLY STRL LF (GAUZE/BANDAGES/DRESSINGS) ×2 IMPLANT
GAUZE XEROFORM 1X8 LF (GAUZE/BANDAGES/DRESSINGS) ×2 IMPLANT
GLOVE BIOGEL PI IND STRL 6.5 (GLOVE) ×3 IMPLANT
GLOVE BIOGEL PI IND STRL 8 (GLOVE) ×2 IMPLANT
GLOVE BIOGEL PI INDICATOR 6.5 (GLOVE) ×3
GLOVE BIOGEL PI INDICATOR 8 (GLOVE) ×2
GLOVE ORTHO TXT STRL SZ7.5 (GLOVE) ×2 IMPLANT
GLOVE SS PI  5.5 STRL (GLOVE) ×1
GLOVE SS PI 5.5 STRL (GLOVE) ×1 IMPLANT
GLOVE SURG ORTHO 8.0 STRL STRW (GLOVE) ×2 IMPLANT
GOWN STRL REUS W/ TWL LRG LVL3 (GOWN DISPOSABLE) ×2 IMPLANT
GOWN STRL REUS W/ TWL XL LVL3 (GOWN DISPOSABLE) ×4 IMPLANT
GOWN STRL REUS W/TWL LRG LVL3 (GOWN DISPOSABLE) ×2
GOWN STRL REUS W/TWL XL LVL3 (GOWN DISPOSABLE) ×4
KIT ROOM TURNOVER OR (KITS) ×2 IMPLANT
MANIFOLD NEPTUNE II (INSTRUMENTS) IMPLANT
PACK ARTHROSCOPY DSU (CUSTOM PROCEDURE TRAY) ×2 IMPLANT
PAD ARMBOARD 7.5X6 YLW CONV (MISCELLANEOUS) ×4 IMPLANT
PADDING CAST COTTON 6X4 STRL (CAST SUPPLIES) ×2 IMPLANT
SET ARTHROSCOPY TUBING (MISCELLANEOUS) ×1
SET ARTHROSCOPY TUBING LN (MISCELLANEOUS) ×1 IMPLANT
SPONGE LAP 4X18 X RAY DECT (DISPOSABLE) ×2 IMPLANT
SUT ETHILON 3 0 PS 1 (SUTURE) ×2 IMPLANT
TOWEL OR 17X24 6PK STRL BLUE (TOWEL DISPOSABLE) ×2 IMPLANT
TOWEL OR 17X26 10 PK STRL BLUE (TOWEL DISPOSABLE) ×2 IMPLANT
WAND HAND CNTRL MULTIVAC 90 (MISCELLANEOUS) IMPLANT
WATER STERILE IRR 1000ML POUR (IV SOLUTION) ×2 IMPLANT

## 2016-09-25 NOTE — H&P (Signed)
Kenneth Frazier is an 41 y.o. male.   Chief Complaint: right knee pain, known meniscal tear HPI:   41 yo male with an acute injury to his right knee that occurred on the job in a work-related accident.  He tried and failed all forms of conservative treatment.  A MRI of his knee does show an acute meniscal tear with otherwise intact cartilage in his knee.  Given his continued pain and mechanical symptoms, a right knee arthroscopy has been recommended and he does wish to proceed given his continued symptoms.  Past Medical History:  Diagnosis Date  . Arthritis   . Asthma   . Borderline diabetes   . CHF (congestive heart failure) (HCC)   . History of pulmonary embolus (PE)   . Hypertension   . Morbid obesity with BMI of 45.0-49.9, adult (HCC)   . Pneumonia   . Right knee meniscal tear    medial and lateral  . Sleep apnea    wears Bipap  . Wears glasses     Past Surgical History:  Procedure Laterality Date  . EYE SURGERY     re-attached retina  . HERNIA REPAIR    . TONSILLECTOMY      Family History  Problem Relation Age of Onset  . COPD Mother   . Hypertension Father    Social History:  reports that he has quit smoking. His smoking use included Cigarettes. He has never used smokeless tobacco. He reports that he drinks alcohol. He reports that he uses drugs.  Allergies: No Known Allergies  Medications Prior to Admission  Medication Sig Dispense Refill  . lisinopril (PRINIVIL,ZESTRIL) 20 MG tablet Take 20 mg by mouth daily.      Results for orders placed or performed during the hospital encounter of 09/25/16 (from the past 48 hour(s))  CBC     Status: None (Preliminary result)   Collection Time: 09/25/16  8:26 AM  Result Value Ref Range   WBC PENDING 4.0 - 10.5 K/uL   RBC 4.92 4.22 - 5.81 MIL/uL   Hemoglobin 14.8 13.0 - 17.0 g/dL   HCT 16.144.9 09.639.0 - 04.552.0 %   MCV 91.3 78.0 - 100.0 fL   MCH 30.1 26.0 - 34.0 pg   MCHC 33.0 30.0 - 36.0 g/dL   RDW 40.914.0 81.111.5 - 91.415.5 %   Platelets PENDING 150 - 400 K/uL   No results found.  Review of Systems  Musculoskeletal: Positive for joint pain.  All other systems reviewed and are negative.   Blood pressure 130/63, pulse 77, temperature 98.1 F (36.7 C), temperature source Oral, resp. rate 20, height 5\' 11"  (1.803 m), weight (!) 355 lb (161 kg), SpO2 99 %. Physical Exam  Constitutional: He is oriented to person, place, and time. He appears well-developed and well-nourished.  HENT:  Head: Normocephalic and atraumatic.  Eyes: Pupils are equal, round, and reactive to light.  Neck: Normal range of motion. Neck supple.  Cardiovascular: Normal rate and regular rhythm.   Respiratory: Effort normal.  GI: Soft. Bowel sounds are normal.  Musculoskeletal:       Right knee: Tenderness found. Medial joint line and lateral joint line tenderness noted.  Neurological: He is alert and oriented to person, place, and time.  Skin: Skin is warm.  Psychiatric: He has a normal mood and affect.     Assessment/Plan Symptomatic right knee lateral meniscal tear  1)  To the OR today for a right knee arthroscopy with a partial meniscectomy.  He understands fulyl  the risks involved.  He also understands that I will need to have him on blood thinning medications for a week post-op given his PE history in the past.  We talked about early mobility, compressive garments, and pumping his feet post-operatively as well.  We will not use a tornaquet for the case either.  Kathryne Hitch, MD 09/25/2016, 9:24 AM

## 2016-09-25 NOTE — Discharge Instructions (Signed)
You may put all of your weight on your right leg as comfort allows. Increase your activities as comfort allows. Expect swelling - ice and elevation as needed. Do pump your feet multiple times throughout the day. Take a 325 mg aspirin twice daily for the next week if you are unable to get the Xarelto (blood thinner) filled. You may remove your dressings tomorrow and get your incisions wet in the shower. Place band-aids over your incisions daily after each shower.

## 2016-09-25 NOTE — Anesthesia Preprocedure Evaluation (Addendum)
Anesthesia Evaluation  Patient identified by MRN, date of birth, ID band Patient awake    Reviewed: Allergy & Precautions, NPO status , Patient's Chart, lab work & pertinent test results  Airway Mallampati: II  TM Distance: <3 FB Neck ROM: Full    Dental  (+) Teeth Intact   Pulmonary asthma , sleep apnea , former smoker,    breath sounds clear to auscultation       Cardiovascular hypertension,  Rhythm:Regular Rate:Normal     Neuro/Psych negative neurological ROS     GI/Hepatic   Endo/Other  Morbid obesity  Renal/GU      Musculoskeletal  (+) Arthritis ,   Abdominal (+) + obese,   Peds  Hematology   Anesthesia Other Findings   Reproductive/Obstetrics                            Anesthesia Physical Anesthesia Plan  ASA: IV  Anesthesia Plan: General   Post-op Pain Management:    Induction: Intravenous  Airway Management Planned: Oral ETT and Video Laryngoscope Planned  Additional Equipment:   Intra-op Plan:   Post-operative Plan: Extubation in OR  Informed Consent: I have reviewed the patients History and Physical, chart, labs and discussed the procedure including the risks, benefits and alternatives for the proposed anesthesia with the patient or authorized representative who has indicated his/her understanding and acceptance.   Dental advisory given  Plan Discussed with:   Anesthesia Plan Comments:         Anesthesia Quick Evaluation

## 2016-09-25 NOTE — Transfer of Care (Signed)
Immediate Anesthesia Transfer of Care Note  Patient: Kenneth JimMatthew Frazier  Procedure(s) Performed: Procedure(s): RIGHT KNEE ARTHROSCOPY WITH PARTIAL Medial and Lateral MENISCECTOMY (Right)  Patient Location: PACU  Anesthesia Type:General  Level of Consciousness: awake and alert   Airway & Oxygen Therapy: Patient Spontanous Breathing and Patient connected to face mask oxygen  Post-op Assessment: Report given to RN, Post -op Vital signs reviewed and stable and Patient moving all extremities X 4  Post vital signs: Reviewed and stable  Last Vitals:  Vitals:   09/25/16 0813  BP: 130/63  Pulse: 77  Resp: 20  Temp: 36.7 C    Last Pain:  Vitals:   09/25/16 0900  TempSrc:   PainSc: 6       Patients Stated Pain Goal: 4 (09/25/16 0900)  Complications: No apparent anesthesia complications

## 2016-09-25 NOTE — Brief Op Note (Signed)
09/25/2016  11:14 AM  PATIENT:  Mason JimMatthew Breslin  41 y.o. male  PRE-OPERATIVE DIAGNOSIS:  right knee medial and lateral meniscal tears  POST-OPERATIVE DIAGNOSIS:  right knee medial and lateral meniscal knee  PROCEDURE:  Procedure(s): RIGHT KNEE ARTHROSCOPY WITH PARTIAL Medial and Lateral MENISCECTOMY (Right)  SURGEON:  Surgeon(s) and Role:    Kathryne Hitch* Jawanda Passey Y, MD - Primary  ANESTHESIA:   local and general  EBL:  Total I/O In: 700 [I.V.:700] Out: 10 [Blood:10]  COUNTS:  YES  TOURNIQUET:  None  DICTATION: .Other Dictation: Dictation Number [id number]  PLAN OF CARE: Discharge to home after PACU  PATIENT DISPOSITION:  PACU - hemodynamically stable.   Delay start of Pharmacological VTE agent (>24hrs) due to surgical blood loss or risk of bleeding: no

## 2016-09-25 NOTE — Anesthesia Procedure Notes (Signed)
Procedure Name: Intubation Date/Time: 09/25/2016 10:22 AM Performed by: Rise PatienceBELL, Reyn Faivre T Pre-anesthesia Checklist: Patient identified, Emergency Drugs available, Suction available and Patient being monitored Patient Re-evaluated:Patient Re-evaluated prior to inductionOxygen Delivery Method: Circle System Utilized Preoxygenation: Pre-oxygenation with 100% oxygen Intubation Type: IV induction and Rapid sequence Ventilation: Oral airway inserted - appropriate to patient size, Two handed mask ventilation required and Mask ventilation with difficulty Laryngoscope Size: Glidescope and 4 Grade View: Grade I Tube type: Oral Tube size: 7.5 mm Number of attempts: 1 Airway Equipment and Method: Stylet and Oral airway Placement Confirmation: ETT inserted through vocal cords under direct vision,  positive ETCO2 and breath sounds checked- equal and bilateral Secured at: 23 cm Tube secured with: Tape Dental Injury: Teeth and Oropharynx as per pre-operative assessment

## 2016-09-25 NOTE — Op Note (Signed)
NAME:  Kenneth Frazier, Kenneth Frazier                   ACCOUNT NO.:  MEDICAL RECORD NO.:  [id number]  LOCATION:                                 FACILITY:  PHYSICIAN:  Vanita PandaChristopher Y. Magnus IvanBlackman, M.D.DATE OF BIRTH:  DATE OF PROCEDURE:  09/25/2016 DATE OF DISCHARGE:                              OPERATIVE REPORT   PREOPERATIVE DIAGNOSIS:  Right knee medial and lateral acute meniscal tear.  POSTOPERATIVE DIAGNOSIS:  Right knee medial and lateral acute meniscal tear.  PROCEDURE:  Right knee arthroscopy with partial medial and lateral meniscectomies.  SURGEON:  Kathryne Hitchhristopher Y Orvetta Danielski, MD.  ANESTHESIA: 1. General. 2. Local with mixture of 4 mg of morphine and plain Marcaine.  BLOOD LOSS:  Minimal.  COMPLICATIONS:  None.  INDICATIONS:  Mr. Aundria RudRogers is a 41 year old gentleman, who is morbidly obese, but has not had any right knee issues before.  He then sustained a work-related accident and injured his right knee.  He tried conservative treatment consisted of anti-inflammatories, activity modification, attempts at weight loss, physical therapy, and even steroid injections to the knee.  An MRI was finally obtained, that did show medial and lateral meniscal tearing, but pretty good looking cartilage in his knee and these appeared to be acute types of tears. With continued mechanical symptoms of locking, catching, and pain, we did recommend arthroscopic intervention.  He understands that any type of surgery given his weight and history of PE in the past is certainly more critical type of surgery and he understands this fully and does wish to proceed given his continued knee problems.  PROCEDURE DESCRIPTION:  After informed consent was obtained, appropriate right knee was marked.  He was brought to the operating room, placed supine on the operating room table.  General anesthesia was then obtained.  His right leg was then prepped and draped with DuraPrep from the thigh down the ankle with sterile  drapes including sterile stockinette with the bed raised and a lateral leg post utilized.  A time- out was called and he identified as correct patient and correct right knee.  I then made an anterior lateral arthroscopy portal.  I went to the medial compartment of the knee.  We did find some grade 2 changes in the medial femoral condyle, some chondromalacia, but I did find an acute meniscal tear in the back of his knee at the posterior horn to midbody area.  Using arthroscopic shaver and up cutting biters, we were able to debride this back to a stable margin, performing a partial medial meniscectomy.  I went to the intercondylar area of the knee and found ACL and PCL to be intact.  Finally, went to the lateral compartment and we did find tearing of the lateral meniscus as well with intact cartilage on the lateral femoral condyle and lateral tibial plateau.  We used arthroscopic shaver and biters to debride this also back to a stable margin performing a partial lateral meniscectomy.  We then allowed fluid to lavage the knee and then drained all fluid from the knee.  We closed the portal sites with interrupted nylon suture.  We inserted a mixture of morphine and Marcaine into the knee.  Xeroform, well-padded  sterile dressings applied.  He was awakened, extubated, and taken to recovery room in stable condition.  All final counts were correct.  There were no complications noted.     Vanita Panda. Magnus Ivan, M.D.     CYB/MEDQ  D:  09/25/2016  T:  09/25/2016  Job:  [id number]

## 2016-09-25 NOTE — Anesthesia Postprocedure Evaluation (Signed)
Anesthesia Post Note  Patient: Kenneth Frazier  Procedure(s) Performed: Procedure(s) (LRB): RIGHT KNEE ARTHROSCOPY WITH PARTIAL Medial and Lateral MENISCECTOMY (Right)     Patient location during evaluation: PACU Anesthesia Type: General Level of consciousness: awake and oriented Pain management: pain level not controlled Vital Signs Assessment: post-procedure vital signs reviewed and stable Respiratory status: spontaneous breathing, nonlabored ventilation, respiratory function stable and patient connected to nasal cannula oxygen Cardiovascular status: blood pressure returned to baseline and stable Postop Assessment: no signs of nausea or vomiting Anesthetic complications: no    Last Vitals:  Vitals:   09/25/16 1145 09/25/16 1200  BP: (!) 106/50 (!) 103/57  Pulse: 82 78  Resp: 16 12  Temp:      Last Pain:  Vitals:   09/25/16 1145  TempSrc:   PainSc: 8                  Anylah Scheib,JAMES TERRILL

## 2016-09-26 ENCOUNTER — Encounter (HOSPITAL_COMMUNITY): Payer: Self-pay | Admitting: Orthopaedic Surgery

## 2016-10-02 ENCOUNTER — Ambulatory Visit (INDEPENDENT_AMBULATORY_CARE_PROVIDER_SITE_OTHER): Payer: Worker's Compensation | Admitting: Orthopaedic Surgery

## 2016-10-02 DIAGNOSIS — Z9889 Other specified postprocedural states: Secondary | ICD-10-CM | POA: Insufficient documentation

## 2016-10-02 DIAGNOSIS — S83281D Other tear of lateral meniscus, current injury, right knee, subsequent encounter: Secondary | ICD-10-CM

## 2016-10-02 MED ORDER — HYDROCODONE-ACETAMINOPHEN 5-325 MG PO TABS: 1.0000 | ORAL_TABLET | Freq: Four times a day (QID) | ORAL | 0 refills | Status: DC | PRN

## 2016-10-02 NOTE — Progress Notes (Signed)
The patient is one-week status post a right knee arthroscopy. We did find an acute medial meniscal tear of the posterior horn and mid body which we debrided back to a stable margin. He does have some grade 2 to grade 3 changes of cartilage on the medial femoral condyle this may be related to time and weight but certainly the meniscus was torn acutely. He is doing well overall. He 7 some pain with pivoting activities.  On examination there is no significant effusion of his right knee and I removed sutures. His get good range of motion as well.  I'm work on weight loss and quad strengthening exercises. I did refill his hydrocodone. I like see him back in 4 weeks to see how is doing overall.

## 2016-10-13 ENCOUNTER — Telehealth (INDEPENDENT_AMBULATORY_CARE_PROVIDER_SITE_OTHER): Payer: Self-pay

## 2016-10-13 NOTE — Telephone Encounter (Signed)
Faxed the 10/03/16 office note to adj per her request

## 2016-10-30 ENCOUNTER — Ambulatory Visit (INDEPENDENT_AMBULATORY_CARE_PROVIDER_SITE_OTHER): Payer: Worker's Compensation | Admitting: Orthopaedic Surgery

## 2016-11-10 ENCOUNTER — Ambulatory Visit (INDEPENDENT_AMBULATORY_CARE_PROVIDER_SITE_OTHER): Payer: Worker's Compensation | Admitting: Orthopaedic Surgery

## 2016-11-17 ENCOUNTER — Ambulatory Visit (INDEPENDENT_AMBULATORY_CARE_PROVIDER_SITE_OTHER): Payer: Worker's Compensation | Admitting: Orthopaedic Surgery

## 2016-11-17 DIAGNOSIS — S83281D Other tear of lateral meniscus, current injury, right knee, subsequent encounter: Secondary | ICD-10-CM

## 2016-11-17 DIAGNOSIS — Z9889 Other specified postprocedural states: Secondary | ICD-10-CM

## 2016-11-17 NOTE — Progress Notes (Signed)
The patient is well-known to me. He is just past 7 weeks status post a right knee arthroscopy with meniscectomy. This all occurred from work-related accident. He never missed any work With this except for having the surgery. He tried physical therapy as well as anti-inflammatories and injections and that did not help so we performed arthroscopic intervention. He said he stays getting better. He still gets some swelling in his knee and some popping and is working on Advice workerquad training exercises.  On examination he is still has some medial joint line tenderness and lateral tennis to be expected given the surgery itself. However there is no significant effusion today and is McMurray's and Lachman's exams are negative.  At this point he is already back to full work activities. We had a long and thorough discussion about his postoperative course. At this point he still can't try to work on weight loss and strengthening his quads. He'll follow up as needed. A disability rating will be forthcoming. There certainly a disability that is associated with this given the fact that he has lost a portion of his meniscus and this can lead to increased contact stresses in the knee which can subsequently lead to early arthritic changes. I disability R rating will be forthcoming. All questions were encouraged and answered. He'll follow up otherwise as needed.

## 2016-11-19 ENCOUNTER — Telehealth (INDEPENDENT_AMBULATORY_CARE_PROVIDER_SITE_OTHER): Payer: Self-pay

## 2016-11-19 NOTE — Telephone Encounter (Signed)
Faxed the 11/17/16 office note to wc adj per her request

## 2016-11-25 ENCOUNTER — Other Ambulatory Visit (INDEPENDENT_AMBULATORY_CARE_PROVIDER_SITE_OTHER): Payer: Self-pay | Admitting: Orthopaedic Surgery

## 2016-11-27 ENCOUNTER — Telehealth (INDEPENDENT_AMBULATORY_CARE_PROVIDER_SITE_OTHER): Payer: Self-pay

## 2016-11-27 NOTE — Telephone Encounter (Signed)
[facility] the 11/25/16 [facility] [employer] Fax#[phone]

## 2016-12-11 ENCOUNTER — Other Ambulatory Visit (INDEPENDENT_AMBULATORY_CARE_PROVIDER_SITE_OTHER): Payer: Self-pay

## 2016-12-16 ENCOUNTER — Telehealth (INDEPENDENT_AMBULATORY_CARE_PROVIDER_SITE_OTHER): Payer: Self-pay

## 2016-12-16 NOTE — Telephone Encounter (Signed)
Faxed completed 25r firm from Dr. Magnus Ivan with rating of 10% to Mia @ [employer]  [phone]

## 2017-11-23 ENCOUNTER — Other Ambulatory Visit: Payer: Self-pay

## 2017-11-23 ENCOUNTER — Emergency Department (HOSPITAL_BASED_OUTPATIENT_CLINIC_OR_DEPARTMENT_OTHER)
Admission: EM | Admit: 2017-11-23 | Discharge: 2017-11-23 | Disposition: A | Discharge status: Home / Self Care | Payer: Self-pay | Attending: Emergency Medicine | Admitting: Emergency Medicine

## 2017-11-23 ENCOUNTER — Encounter (HOSPITAL_BASED_OUTPATIENT_CLINIC_OR_DEPARTMENT_OTHER): Payer: Self-pay | Admitting: *Deleted

## 2017-11-23 DIAGNOSIS — Z87891 Personal history of nicotine dependence: Secondary | ICD-10-CM | POA: Insufficient documentation

## 2017-11-23 DIAGNOSIS — Z79899 Other long term (current) drug therapy: Secondary | ICD-10-CM | POA: Insufficient documentation

## 2017-11-23 DIAGNOSIS — Z7901 Long term (current) use of anticoagulants: Secondary | ICD-10-CM | POA: Insufficient documentation

## 2017-11-23 DIAGNOSIS — I509 Heart failure, unspecified: Secondary | ICD-10-CM | POA: Insufficient documentation

## 2017-11-23 DIAGNOSIS — Z86711 Personal history of pulmonary embolism: Secondary | ICD-10-CM | POA: Insufficient documentation

## 2017-11-23 DIAGNOSIS — J45909 Unspecified asthma, uncomplicated: Secondary | ICD-10-CM | POA: Insufficient documentation

## 2017-11-23 DIAGNOSIS — H60332 Swimmer's ear, left ear: Secondary | ICD-10-CM | POA: Insufficient documentation

## 2017-11-23 DIAGNOSIS — I110 Hypertensive heart disease with heart failure: Secondary | ICD-10-CM | POA: Insufficient documentation

## 2017-11-23 MED ORDER — CIPROFLOXACIN-DEXAMETHASONE 0.3-0.1 % OT SUSP
4.0000 [drp] | Freq: Once | OTIC | Status: AC
  Administered 2017-11-23: 4 [drp] via OTIC
  Filled 2017-11-23: qty 7.5

## 2017-11-23 NOTE — ED Triage Notes (Signed)
Pain in his left ear for a week. He has been taking Augmentin with no improvement.

## 2017-11-23 NOTE — Discharge Instructions (Addendum)
Use 3 drops twice daily for one week Try over the counter ear drops for pain relief (Hyland's, etc) along with warm compresses and Ibuprofen/Tylenol Please return if worsening

## 2017-11-23 NOTE — ED Provider Notes (Signed)
[facility] EMERGENCY DEPARTMENT Provider Note   CSN: [id number] Arrival date & time: 11/23/17  1702     History   Chief Complaint Chief Complaint  Patient presents with  . Otalgia    HPI Kenneth Frazier is a 42 y.o. male who presents with L ear pain. PMH significant for CHF EF 55%, hx of PE, HTN, pre-diabetes. He's had the pain for one week. He went to the ED three days ago and was started on Augmentin and given Percocet. He's been taking these without relief.  He states that he feels like the pain is spreading somewhat to his jaw and behind his ear.  He denies fever.  He feels like the area is swollen and his ear canal is swollen but has not had any drainage from the area.  He states he has a pool and has gone swimming recently.  HPI  Past Medical History:  Diagnosis Date  . Arthritis   . Asthma   . Borderline diabetes   . CHF (congestive heart failure) (HCC)   . History of pulmonary embolus (PE)   . Hypertension   . Morbid obesity with BMI of 45.0-49.9, adult (HCC)   . Pneumonia   . Right knee meniscal tear    medial and lateral  . Sleep apnea    wears Bipap  . Wears glasses     Patient Active Problem List   Diagnosis Date Noted  . Status post arthroscopy of right knee 10/02/2016  . Acute lateral meniscus tear of right knee 09/03/2016    Past Surgical History:  Procedure Laterality Date  . EYE SURGERY     re-attached retina  . HERNIA REPAIR    . KNEE ARTHROSCOPY Right 09/25/2016   Procedure: RIGHT KNEE ARTHROSCOPY WITH PARTIAL Medial and Lateral MENISCECTOMY;  Surgeon: Kathryne HitchBlackman, Christopher Y, MD;  Location: [facility] OR;  Service: Orthopedics;  Laterality: Right;  . TONSILLECTOMY          Home Medications    Prior to Admission medications   Medication Sig Start Date End Date Taking? Authorizing Provider  HYDROcodone-acetaminophen (NORCO) 5-325 MG tablet Take 1-2 tablets by mouth every 6 (six) hours as needed for moderate pain. 10/02/16   Kathryne HitchBlackman,  Christopher Y, MD  lisinopril (PRINIVIL,ZESTRIL) 20 MG tablet Take 20 mg by mouth daily.    [provider]  rivaroxaban (XARELTO) 10 MG TABS tablet Take 1 tablet (10 mg total) by mouth daily. 09/25/16   Kathryne HitchBlackman, Christopher Y, MD    Family History Family History  Problem Relation Age of Onset  . COPD Mother   . Hypertension Father     Social History Social History   Tobacco Use  . Smoking status: Former Smoker    Types: Cigarettes  . Smokeless tobacco: Never Used  . Tobacco comment: quit smoking cigarettes in 2015  Substance Use Topics  . Alcohol use: Yes    Comment: occasional  . Drug use: Yes    Comment: polysubstance last use 2015     Allergies   Patient has no known allergies.   Review of Systems Review of Systems  Constitutional: Negative for fever.  HENT: Positive for ear pain. Negative for ear discharge.      Physical Exam Updated Vital Signs BP (!) 142/78   Pulse 80   Temp 98 F (36.7 C) (Oral)   Resp 20   Ht 5\' 11"  (1.803 m)   Wt (!) 177.8 kg (392 lb)   SpO2 93%   BMI 54.67  kg/m   Physical Exam  Constitutional: He is oriented to person, place, and time. He appears well-developed and well-nourished. No distress.  Calm, cooperative, morbidly obese  HENT:  Head: Normocephalic and atraumatic.  Right Ear: Hearing, tympanic membrane, external ear and ear canal normal.  Left Ear: External ear normal. There is swelling and tenderness. No drainage. Decreased hearing is noted.  Significant swelling of the left ear canal.  Unable to visualize the tympanic membrane.  Diffuse tenderness around the pre-and postauricular lymph nodes.  Mild tenderness over the mastoid without erythema or bogginess.  Eyes: Pupils are equal, round, and reactive to light. Conjunctivae are normal. Right eye exhibits no discharge. Left eye exhibits no discharge. No scleral icterus.  Neck: Normal range of motion.  Cardiovascular: Normal rate.  Pulmonary/Chest: Effort  normal. No respiratory distress.  Abdominal: He exhibits no distension.  Neurological: He is alert and oriented to person, place, and time.  Skin: Skin is warm and dry.  Psychiatric: He has a normal mood and affect. His behavior is normal.  Nursing note and vitals reviewed.    ED Treatments / Results  Labs (all labs ordered are listed, but only abnormal results are displayed) Labs Reviewed - No data to display  EKG None  Radiology No results found.  Procedures Procedures (including critical care time)  Medications Ordered in ED Medications - No data to display   Initial Impression / Assessment and Plan / ED Course  I have reviewed the triage vital signs and the nursing notes.  Pertinent labs & imaging results that were available during my care of the patient were reviewed by me and considered in my medical decision making (see chart for details).  42 year old male presents with ongoing L ear pain. Vitals are normal. He is over all well-appearing. On exam he definitely has otitis externa and I'm unable to see his TM because of swelling. An ear wick was inserted and he was given Ciprodex for home. He does not have evidence of mastoiditis. Since I cannot see his TM I advised to continue Augmentin. He was also advised to try OTC pain drops and to return if worsening.  Final Clinical Impressions(s) / ED Diagnoses   Final diagnoses:  Acute swimmer's ear of left side    ED Discharge Orders    None       Bethel Born, PA-C 11/23/17 1842    Terrilee Files, MD 11/24/17 1017
